# Patient Record
Sex: Female | Born: 1935 | Race: Black or African American | Hispanic: No | Marital: Single | Smoking: Never smoker
Health system: Southern US, Community
[De-identification: ages and names within clinical notes are randomized; demographics above are authoritative.]

## PROBLEM LIST (undated history)

## (undated) DIAGNOSIS — E119 Type 2 diabetes mellitus without complications: Secondary | ICD-10-CM

## (undated) DIAGNOSIS — I10 Essential (primary) hypertension: Secondary | ICD-10-CM

## (undated) DIAGNOSIS — J45909 Unspecified asthma, uncomplicated: Secondary | ICD-10-CM

## (undated) HISTORY — PX: ABDOMINAL HYSTERECTOMY: SHX81

---

## 2016-07-22 ENCOUNTER — Emergency Department (HOSPITAL_COMMUNITY): Payer: Medicare Other

## 2016-07-22 ENCOUNTER — Emergency Department (HOSPITAL_COMMUNITY)
Admission: EM | Admit: 2016-07-22 | Discharge: 2016-07-22 | Disposition: A | Discharge status: Home / Self Care | Payer: Medicare Other | Attending: Emergency Medicine | Admitting: Emergency Medicine

## 2016-07-22 ENCOUNTER — Encounter (HOSPITAL_COMMUNITY): Payer: Self-pay | Admitting: Emergency Medicine

## 2016-07-22 DIAGNOSIS — J45909 Unspecified asthma, uncomplicated: Secondary | ICD-10-CM | POA: Diagnosis not present

## 2016-07-22 DIAGNOSIS — I951 Orthostatic hypotension: Secondary | ICD-10-CM | POA: Diagnosis not present

## 2016-07-22 DIAGNOSIS — R4182 Altered mental status, unspecified: Secondary | ICD-10-CM | POA: Diagnosis present

## 2016-07-22 DIAGNOSIS — I10 Essential (primary) hypertension: Secondary | ICD-10-CM | POA: Diagnosis not present

## 2016-07-22 DIAGNOSIS — E119 Type 2 diabetes mellitus without complications: Secondary | ICD-10-CM | POA: Insufficient documentation

## 2016-07-22 DIAGNOSIS — R55 Syncope and collapse: Secondary | ICD-10-CM

## 2016-07-22 DIAGNOSIS — Z794 Long term (current) use of insulin: Secondary | ICD-10-CM | POA: Insufficient documentation

## 2016-07-22 HISTORY — DX: Type 2 diabetes mellitus without complications: E11.9

## 2016-07-22 HISTORY — DX: Unspecified asthma, uncomplicated: J45.909

## 2016-07-22 HISTORY — DX: Essential (primary) hypertension: I10

## 2016-07-22 LAB — URINALYSIS, ROUTINE W REFLEX MICROSCOPIC
BILIRUBIN URINE: NEGATIVE
Bacteria, UA: NONE SEEN
Hgb urine dipstick: NEGATIVE
Ketones, ur: 20 mg/dL — AB
LEUKOCYTES UA: NEGATIVE
NITRITE: NEGATIVE
PH: 5 (ref 5.0–8.0)
Protein, ur: NEGATIVE mg/dL
SPECIFIC GRAVITY, URINE: 1.012 (ref 1.005–1.030)
Squamous Epithelial / LPF: NONE SEEN

## 2016-07-22 LAB — COMPREHENSIVE METABOLIC PANEL
ALBUMIN: 3.7 g/dL (ref 3.5–5.0)
ALK PHOS: 69 U/L (ref 38–126)
ALT: 12 U/L — ABNORMAL LOW (ref 14–54)
ANION GAP: 12 (ref 5–15)
AST: 17 U/L (ref 15–41)
BILIRUBIN TOTAL: 0.9 mg/dL (ref 0.3–1.2)
BUN: 8 mg/dL (ref 6–20)
CALCIUM: 9.4 mg/dL (ref 8.9–10.3)
CO2: 25 mmol/L (ref 22–32)
Chloride: 101 mmol/L (ref 101–111)
Creatinine, Ser: 0.89 mg/dL (ref 0.44–1.00)
GFR calc non Af Amer: 60 mL/min — ABNORMAL LOW (ref >60)
GLUCOSE: 269 mg/dL — AB (ref 65–99)
POTASSIUM: 3.7 mmol/L (ref 3.5–5.1)
SODIUM: 138 mmol/L (ref 135–145)
TOTAL PROTEIN: 6.6 g/dL (ref 6.5–8.1)

## 2016-07-22 LAB — I-STAT TROPONIN, ED: Troponin i, poc: 0.01 ng/mL (ref 0.00–0.08)

## 2016-07-22 LAB — CBG MONITORING, ED: GLUCOSE-CAPILLARY: 269 mg/dL — AB (ref 65–99)

## 2016-07-22 LAB — CBC WITH DIFFERENTIAL/PLATELET
BASOS ABS: 0 10*3/uL (ref 0.0–0.1)
BASOS PCT: 1 %
Eosinophils Absolute: 0 10*3/uL (ref 0.0–0.7)
Eosinophils Relative: 1 %
HEMATOCRIT: 40.6 % (ref 36.0–46.0)
HEMOGLOBIN: 14 g/dL (ref 12.0–15.0)
LYMPHS PCT: 31 %
Lymphs Abs: 1.1 10*3/uL (ref 0.7–4.0)
MCH: 32 pg (ref 26.0–34.0)
MCHC: 34.5 g/dL (ref 30.0–36.0)
MCV: 92.9 fL (ref 78.0–100.0)
Monocytes Absolute: 0.2 10*3/uL (ref 0.1–1.0)
Monocytes Relative: 5 %
NEUTROS ABS: 2.3 10*3/uL (ref 1.7–7.7)
NEUTROS PCT: 62 %
Platelets: 174 10*3/uL (ref 150–400)
RBC: 4.37 MIL/uL (ref 3.87–5.11)
RDW: 12.9 % (ref 11.5–15.5)
WBC: 3.7 10*3/uL — ABNORMAL LOW (ref 4.0–10.5)

## 2016-07-22 LAB — LIPASE, BLOOD: Lipase: 19 U/L (ref 11–51)

## 2016-07-22 MED ORDER — SODIUM CHLORIDE 0.9 % IV BOLUS (SEPSIS)
500.0000 mL | Freq: Once | INTRAVENOUS | Status: AC
  Administered 2016-07-22: 500 mL via INTRAVENOUS

## 2016-07-22 NOTE — ED Notes (Signed)
EDP made aware of patient's altered mental status.

## 2016-07-22 NOTE — ED Notes (Signed)
Patient given sandwich and ginger ale 

## 2016-07-22 NOTE — ED Notes (Signed)
PT was ambulated to the restroom but was able to urinate into the hat.  A sample was not able to be obtained.  ED-P made aware.  Giving pt fluids and going to try and in and out cath.

## 2016-07-22 NOTE — ED Provider Notes (Signed)
[facility]PT Provider Note   CSN: [id number] Arrival date & time: 07/22/16  1636     History   Chief Complaint Chief Complaint  Patient presents with  . Altered Mental Status    HPI Alyssa Haney is a 83 y.o. female.  HPI Patient is cared for by her daughter in her home. The patient's daughter reports that she went out to the store for about 15 minutes. She was sure she left her mother seem normal at her baseline. When she returned her mother reported she didn't feel well and needed to go to the hospital. She cited some abdominal pain. The patient's daughter reports she was asking her more questions and then she slumped over to the side and became poorly responsive. She then called EMS. He did not note one focal area dysfunction such as one extremity or speech slurring. He reports the patient was at her baseline health for this episode. He does report at baseline however she has very poor recall and memory. Patient has had poorly controlled diabetes. Patient's daughter does report that the patient had been noncompliant with her blood pressure medications and her insulin prior to coming to stay with her, probably for about a month or more. Her daughter reports that she did have her take her blood pressure medications this morning and wonders if maybe that contributed to her episode. Past Medical History:  Diagnosis Date  . Asthma   . Diabetes mellitus without complication (HCC)   . Hypertension     There are no active problems to display for this patient.   Past Surgical History:  Procedure Laterality Date  . ABDOMINAL HYSTERECTOMY      OB History    No data available       Home Medications    Prior to Admission medications   Medication Sig Start Date End Date Taking? Authorizing Provider  acetaminophen (TYLENOL) 500 MG tablet Take 500 mg by mouth every 6 (six) hours as needed for mild pain.   Yes Historical Provider, MD  albuterol (PROVENTIL HFA;VENTOLIN HFA)  108 (90 Base) MCG/ACT inhaler Inhale 1 puff into the lungs every 6 (six) hours as needed for wheezing or shortness of breath.   Yes Historical Provider, MD  atorvastatin (LIPITOR) 10 MG tablet Take 10 mg by mouth every evening.   Yes Historical Provider, MD  gabapentin (NEURONTIN) 400 MG capsule Take 400 mg by mouth every 8 (eight) hours as needed. For pain   Yes Historical Provider, MD  hydrochlorothiazide (HYDRODIURIL) 25 MG tablet Take 25 mg by mouth daily.   Yes Historical Provider, MD  insulin aspart protamine- aspart (NOVOLOG MIX 70/30) (70-30) 100 UNIT/ML injection Inject into the skin.   Yes Historical Provider, MD  losartan (COZAAR) 25 MG tablet Take 25 mg by mouth daily.   Yes Historical Provider, MD    Family History History reviewed. No pertinent family history.  Social History Social History  Substance Use Topics  . Smoking status: Never Smoker  . Smokeless tobacco: Never Used  . Alcohol use No     Allergies   Penicillins; Potassium-containing compounds; and Shrimp [shellfish allergy]   Review of Systems Review of Systems 10 Systems reviewed and are negative for acute change except as noted in the HPI.   Physical Exam Updated Vital Signs BP 152/87   Pulse 76   Temp 97.8 F (36.6 C) (Oral)   Resp 17   Ht 5\' 3"  (1.6 m)   Wt 115 lb (52.2 kg)  SpO2 98%   BMI 20.37 kg/m   Physical Exam  Constitutional: She appears well-developed and well-nourished. No distress.  Consideration is alert and in no distress. No respiratory distress.  HENT:  Head: Normocephalic and atraumatic.  Nose: Nose normal.  Mouth/Throat: Oropharynx is clear and moist.  Eyes: EOM are normal. Pupils are equal, round, and reactive to light.  Neck: Neck supple.  Cardiovascular: Normal rate, regular rhythm, normal heart sounds and intact distal pulses.   Pulmonary/Chest: Effort normal and breath sounds normal.  Abdominal: Soft. She exhibits no distension. There is no tenderness.    Musculoskeletal: Normal range of motion. She exhibits no edema, tenderness or deformity.  Neurological:  Patient is alert and interactive. She however cannot tell me time and location. Patient's daughter reports this is not atypical for her. She has 5\5 upper extremity strength. No pronator drift. Patient can elevate each leg independently off the bed and hold 5 seconds. No cranial nerve deficit.  Skin: Skin is warm and dry.  Psychiatric: She has a normal mood and affect.     ED Treatments / Results  Labs (all labs ordered are listed, but only abnormal results are displayed) Labs Reviewed  COMPREHENSIVE METABOLIC PANEL - Abnormal; Notable for the following:       Result Value   Glucose, Bld 269 (*)    ALT 12 (*)    GFR calc non Af Amer 60 (*)    All other components within normal limits  CBC WITH DIFFERENTIAL/PLATELET - Abnormal; Notable for the following:    WBC 3.7 (*)    All other components within normal limits  URINALYSIS, ROUTINE W REFLEX MICROSCOPIC - Abnormal; Notable for the following:    Color, Urine STRAW (*)    Glucose, UA >=500 (*)    Ketones, ur 20 (*)    All other components within normal limits  CBG MONITORING, ED - Abnormal; Notable for the following:    Glucose-Capillary 269 (*)    All other components within normal limits  LIPASE, BLOOD  I-STAT TROPOININ, ED    EKG  EKG Interpretation  Date/Time:  Monday July 22 2016 16:46:45 EST Ventricular Rate:  77 PR Interval:    QRS Duration: 116 QT Interval:  387 QTC Calculation: 421 R Axis:   40 Text Interpretation:  Sinus rhythm Ventricular premature complex Sinus pause Nonspecific intraventricular conduction delay Low voltage, precordial leads Probable anteroseptal infarct, old Baseline wander in lead(s) I II aVR agree. nonspecific t wave changes Confirmed by Donnald GarrePfeiffer, MD, Lebron ConnersMarcy [phone]([id number]) on 07/22/2016 4:52:00 PM       Radiology Dg Chest 2 View  Result Date: 07/22/2016 CLINICAL DATA:  Unresponsive  patient. Altered mental status and confusion. EXAM: CHEST  2 VIEW COMPARISON:  None. FINDINGS: Atherosclerotic aortic arch. Heart size within normal limits. Thoracic spondylosis. Bony demineralization. The lungs appear clear.  No pleural effusion. IMPRESSION: 1.  No active cardiopulmonary disease is radiographically apparent. 2. Atherosclerotic aortic arch. 3. Thoracic spondylosis. Electronically Signed   By: Gaylyn RongWalter  Liebkemann M.D.   On: 07/22/2016 18:30   Ct Head Wo Contrast  Result Date: 07/22/2016 CLINICAL DATA:  Acute onset of altered mental status. Initial encounter. EXAM: CT HEAD WITHOUT CONTRAST TECHNIQUE: Contiguous axial images were obtained from the base of the skull through the vertex without intravenous contrast. COMPARISON:  None. FINDINGS: Brain: No evidence of acute infarction, hemorrhage, hydrocephalus, extra-axial collection or mass lesion/mass effect. Prominence of the ventricles and sulci reflects mild cortical volume loss. Scattered periventricular and subcortical white  matter change likely reflects small ischemic microangiopathy. The brainstem and fourth ventricle are within normal limits. The basal ganglia are unremarkable in appearance. The cerebral hemispheres demonstrate grossly normal gray-white differentiation. No mass effect or midline shift is seen. Vascular: No hyperdense vessel or unexpected calcification. Skull: There is no evidence of fracture; visualized osseous structures are unremarkable in appearance. Sinuses/Orbits: The visualized portions of the orbits are within normal limits. The paranasal sinuses and mastoid air cells are well-aerated. Other: No significant soft tissue abnormalities are seen. IMPRESSION: 1. No acute intracranial pathology seen on CT. 2. Mild cortical volume loss and scattered small vessel ischemic microangiopathy. Electronically Signed   By: Roanna Raider M.D.   On: 07/22/2016 18:56    Procedures Procedures (including critical care  time)  Medications Ordered in ED Medications  sodium chloride 0.9 % bolus 500 mL (0 mLs Intravenous Stopped 07/22/16 2107)     Initial Impression / Assessment and Plan / ED Course  I have reviewed the triage vital signs and the nursing notes.  Pertinent labs & imaging results that were available during my care of the patient were reviewed by me and considered in my medical decision making (see chart for details).  Clinical Course     Final Clinical Impressions(s) / ED Diagnoses   Final diagnoses:  Near syncope  Orthostatic hypotension   Patient had a fairly acute onset of feeling weak and slumping over. He denied associated symptoms. Diagnostic workup does not show acute anomaly. The patient did have positive orthostatic testing. Patient had just resumed blood pressure medications today after being noncompliant for approximately a month. The patient is well in appearance, I suspect likely near syncope due to orthostatic hypotension. Patient  has had some rehydration in the emergency department and now has been up and ambulatory and remained in stable condition. I have counseled the patient's daughter to withhold the hydrochlorothiazide for the next several days and monitor her home blood pressures. They will continue her losartan and diabetic medications. Patient's daughters counseled on signs and symptoms for which return. New Prescriptions New Prescriptions   No medications on file     Arby Barrette, MD 07/22/16 2328

## 2016-07-22 NOTE — ED Triage Notes (Signed)
Per EMS, patient's family called EMS because of unresponsiveness.  When EMS arrived patient was slumped in chair but did respond when called by name or asked questions.  CBG 299, 143/80, 72 HR, 100% RA, RR 18.  LSN at 1515.  Sinus on monitor.  22G left wrist.  Patient complaining of abdominal pain on right side.  Hx of constipation.  NIH 0.  Electing to be non-verbal per EMS.  VS stable.  No meds given en route.   Denies N/V/D/fever.  Daughter bringing med list.

## 2016-07-22 NOTE — ED Notes (Signed)
Pt complaining of pain on lower right abdominal region.

## 2016-07-22 NOTE — Discharge Instructions (Signed)
Do not take your hydrochlorothiazide for the next 2-3 days. Monitor your blood pressure. If your blood pressure remains greater than 140 (top number), you may resume taking your hydrochlorothiazide.

## 2016-08-09 ENCOUNTER — Encounter (HOSPITAL_COMMUNITY): Payer: Self-pay | Admitting: Emergency Medicine

## 2016-08-09 ENCOUNTER — Emergency Department (HOSPITAL_COMMUNITY)
Admission: EM | Admit: 2016-08-09 | Discharge: 2016-08-09 | Disposition: A | Discharge status: Home / Self Care | Payer: Medicare Other | Attending: Emergency Medicine | Admitting: Emergency Medicine

## 2016-08-09 DIAGNOSIS — E119 Type 2 diabetes mellitus without complications: Secondary | ICD-10-CM | POA: Insufficient documentation

## 2016-08-09 DIAGNOSIS — J45909 Unspecified asthma, uncomplicated: Secondary | ICD-10-CM | POA: Diagnosis not present

## 2016-08-09 DIAGNOSIS — Y939 Activity, unspecified: Secondary | ICD-10-CM | POA: Diagnosis not present

## 2016-08-09 DIAGNOSIS — W01198A Fall on same level from slipping, tripping and stumbling with subsequent striking against other object, initial encounter: Secondary | ICD-10-CM | POA: Insufficient documentation

## 2016-08-09 DIAGNOSIS — Z794 Long term (current) use of insulin: Secondary | ICD-10-CM | POA: Diagnosis not present

## 2016-08-09 DIAGNOSIS — I10 Essential (primary) hypertension: Secondary | ICD-10-CM | POA: Diagnosis not present

## 2016-08-09 DIAGNOSIS — Y999 Unspecified external cause status: Secondary | ICD-10-CM | POA: Insufficient documentation

## 2016-08-09 DIAGNOSIS — W19XXXA Unspecified fall, initial encounter: Secondary | ICD-10-CM

## 2016-08-09 DIAGNOSIS — S0993XA Unspecified injury of face, initial encounter: Secondary | ICD-10-CM | POA: Diagnosis present

## 2016-08-09 DIAGNOSIS — Z79899 Other long term (current) drug therapy: Secondary | ICD-10-CM | POA: Diagnosis not present

## 2016-08-09 DIAGNOSIS — Y92019 Unspecified place in single-family (private) house as the place of occurrence of the external cause: Secondary | ICD-10-CM | POA: Diagnosis not present

## 2016-08-09 DIAGNOSIS — S0083XA Contusion of other part of head, initial encounter: Secondary | ICD-10-CM | POA: Diagnosis not present

## 2016-08-09 MED ORDER — HALOPERIDOL LACTATE 5 MG/ML IJ SOLN
3.0000 mg | Freq: Once | INTRAMUSCULAR | Status: AC
  Administered 2016-08-09: 3 mg via INTRAMUSCULAR
  Filled 2016-08-09: qty 1

## 2016-08-09 MED ORDER — LORAZEPAM 1 MG PO TABS
0.5000 mg | ORAL_TABLET | Freq: Once | ORAL | Status: AC
  Administered 2016-08-09: 0.5 mg via ORAL
  Filled 2016-08-09: qty 1

## 2016-08-09 NOTE — ED Triage Notes (Signed)
Pt tripped on steps walking out of house and hit face on concrete. Pt has abrasions to right side of face around eye and forehead. Bleeding controlled. Pt did not lose consciousness. Pt alert and oriented to baseline. Denies neck pain. CBG 415. Pt is ambulatory. BP 150/90, HR 96

## 2016-08-09 NOTE — ED Notes (Signed)
Abrasions to R side of face cleaned with saline, bacitracin applied; pt tolerated well.

## 2016-08-09 NOTE — ED Provider Notes (Signed)
[facility] Provider Note   CSN: [id number] Arrival date & time: 08/09/16  1004     History   Chief Complaint Chief Complaint  Patient presents with  . Fall    HPI Maia PettiesMary Brue is a 83 y.o. female.   HPI   83 year old female presenting after fall. She is coming down a few stabs out of the house when she tripped and fell forward. She struck her face on concrete surface. No loss of consciousness. She denies any acute pain. She is generally very agitated and pacing around her room. She has had to be returned to her room multiple times by various staff members. Her daughter reports that she recently [place] from [place] to live with her. Her behavior has been very difficult to deal with. Her daughter feels very overwhelmed with her behavior. She reports that her behavior exhibited the emergency room today is consistent with the way she has been behaving at home for the past several weeks. She often refusing to take her medications. With me she is very uncooperative. She known she is in the hospital but insists she doesn't need to be here. She denies having any pain anywhere. No blood thinners per med list. No vomiting.   Past Medical History:  Diagnosis Date  . Asthma   . Diabetes mellitus without complication (HCC)   . Hypertension     There are no active problems to display for this patient.   Past Surgical History:  Procedure Laterality Date  . ABDOMINAL HYSTERECTOMY      OB History    No data available       Home Medications    Prior to Admission medications   Medication Sig Start Date End Date Taking? Authorizing Provider  acetaminophen (TYLENOL) 500 MG tablet Take 500 mg by mouth every 6 (six) hours as needed for mild pain.    Historical Provider, MD  albuterol (PROVENTIL HFA;VENTOLIN HFA) 108 (90 Base) MCG/ACT inhaler Inhale 1 puff into the lungs every 6 (six) hours as needed for wheezing or shortness of breath.    Historical Provider, MD    atorvastatin (LIPITOR) 10 MG tablet Take 10 mg by mouth every evening.    Historical Provider, MD  gabapentin (NEURONTIN) 400 MG capsule Take 400 mg by mouth every 8 (eight) hours as needed. For pain    Historical Provider, MD  hydrochlorothiazide (HYDRODIURIL) 25 MG tablet Take 25 mg by mouth daily.    Historical Provider, MD  insulin aspart protamine- aspart (NOVOLOG MIX 70/30) (70-30) 100 UNIT/ML injection Inject into the skin.    Historical Provider, MD  losartan (COZAAR) 25 MG tablet Take 25 mg by mouth daily.    Historical Provider, MD    Family History History reviewed. No pertinent family history.  Social History Social History  Substance Use Topics  . Smoking status: Never Smoker  . Smokeless tobacco: Never Used  . Alcohol use No     Allergies   Penicillins; Potassium-containing compounds; and Shrimp [shellfish allergy]   Review of Systems Review of Systems  All systems reviewed and negative, other than as noted in HPI.   Physical Exam Updated Vital Signs BP 148/76   Pulse 79   Temp 98.5 F (36.9 C) (Oral)   Resp 20   SpO2 100%   Physical Exam  Constitutional: She appears well-developed and well-nourished. No distress.  Sitting in bed. She is agitated. She is only briefly redirectable.  HENT:  Head: Normocephalic.  Several abrasions around the right  eye and right cheek. No active bleeding. Significant facial tenderness. No midline spinal tenderness.  Eyes: Conjunctivae are normal. Right eye exhibits no discharge. Left eye exhibits no discharge.  Neck: Neck supple.  Cardiovascular: Normal rate, regular rhythm and normal heart sounds.  Exam reveals no gallop and no friction rub.   No murmur heard. Pulmonary/Chest: Effort normal and breath sounds normal. No respiratory distress.  Abdominal: Soft. She exhibits no distension. There is no tenderness.  Musculoskeletal: She exhibits no edema or tenderness.  Range of motion the large joints without any apparent  discomfort.  Neurological: She is alert.  Cranial nerves II through XII are intact bilaterally. Very good strength bilateral upper and lower extremities.  Skin: Skin is warm and dry.  Psychiatric:  Agitated. She is upset about having to be in the ED. She needs constant redirection.   Nursing note and vitals reviewed.    ED Treatments / Results  Labs (all labs ordered are listed, but only abnormal results are displayed) Labs Reviewed - No data to display  EKG  EKG Interpretation None       Radiology No results found.  Procedures Procedures (including critical care time)  Medications Ordered in ED Medications  haloperidol lactate (HALDOL) injection 3 mg (3 mg Intramuscular Given 08/09/16 1448)  LORazepam (ATIVAN) tablet 0.5 mg (0.5 mg Oral Given 08/09/16 1447)     Initial Impression / Assessment and Plan / ED Course  I have reviewed the triage vital signs and the nursing notes.  Pertinent labs & imaging results that were available during my care of the patient were reviewed by me and considered in my medical decision making (see chart for details).     83 year old female presenting after a fall. She does have some abrasions to her face. Overall, I have a very low suspicion for significant facial fracture or intracranial bleeding. Her daughter is very concerned about her ability to safely take care for the patient at home though. Her behavior is very erratic and she is often combative. This is not new behavior since his fall today. Her daughter is requesting to speak with social work she is not sure where to turn to next. I think this is very reasonable. We'll have to give her some sedating medications just keep her from walking health. Realistically though, we probably won't be able to place tonight. I doubt pt would be agreeable to this anyways. Daughter may have to take her home with her but will at least try to see what options she may have.  5:15 PM Pt now calmer but  works herself up when you talk to her more than briefly. She still is up and ambulatory. Has been in the ED over 7 hours now. I still do not feel strongly she needs neuro imaging.   Final Clinical Impressions(s) / ED Diagnoses   Final diagnoses:  Fall, initial encounter  Contusion of face, initial encounter    New Prescriptions New Prescriptions   No medications on file     Raeford Razor, MD 08/21/16 1312

## 2016-08-09 NOTE — ED Triage Notes (Signed)
Gave pt ice pack for swelling to face and placed gauze over forehead

## 2016-08-09 NOTE — Progress Notes (Signed)
CSW spoke with pt re: ALF placement for pt. At this time, pt's has [place] Medicaid and this would need to be transitioned to [place] in order for pt to (potentially) receive help to pay for ALF care.  Daughter encouraged to [place] to [place].  Daughter also provided contact information for Adult Day Care.  Private pay services may be an option for pt and daughter has a few contacts who may be available for hire.  Emotional support provided.

## 2018-04-25 IMAGING — CT CT HEAD W/O CM
4 series · 16 of 47 positions shown, 18 images · non-contrast
Comparison: None.

CLINICAL DATA: Acute onset of altered mental status. Initial
encounter.

EXAM:
CT HEAD WITHOUT CONTRAST
TECHNIQUE: Contiguous axial images were obtained from the base of the skull
through the vertex without intravenous contrast.

[Series 2: head without · axial · non-contrast · 0.43mm/px · z∈[+1120,+1230]mm · 7 of 30 slices shown, 9 images]
[im 4/30  brain]
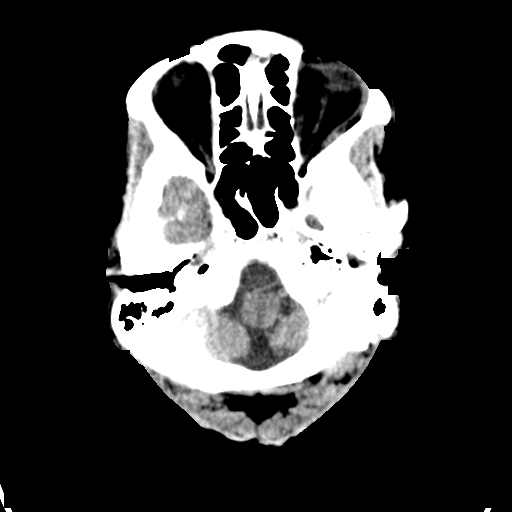
[im 4/30  bone]
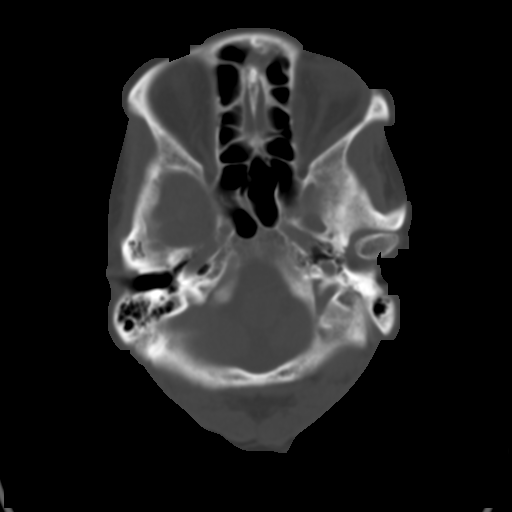
[im 8/30  brain]
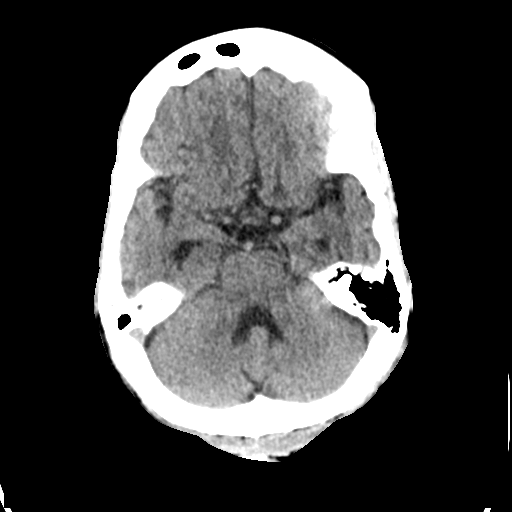
[im 11/30  brain]
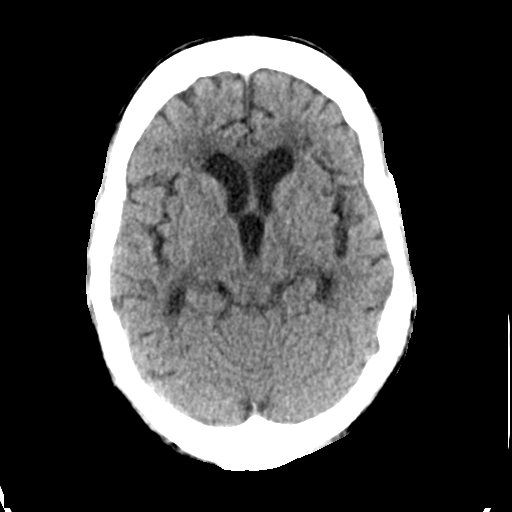
[im 15/30  brain]
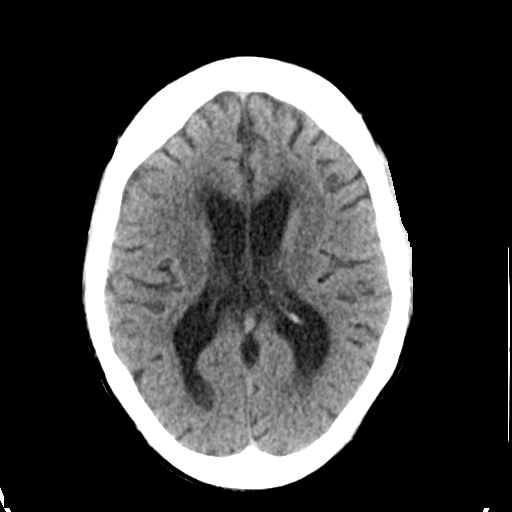
[im 19/30  brain]
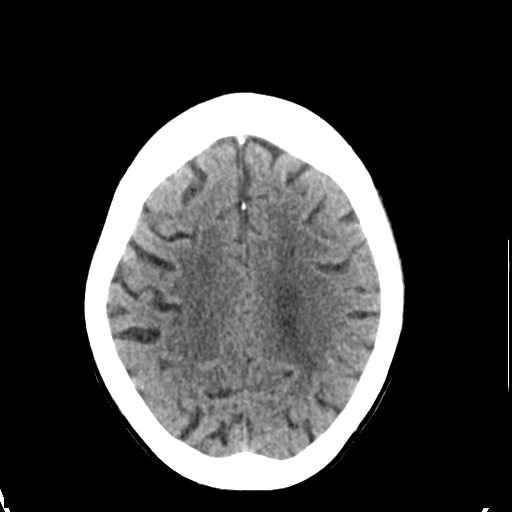
[im 19/30  bone]
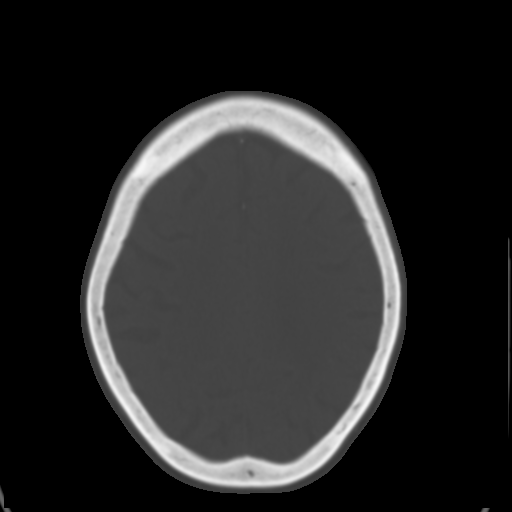
[im 22/30  brain]
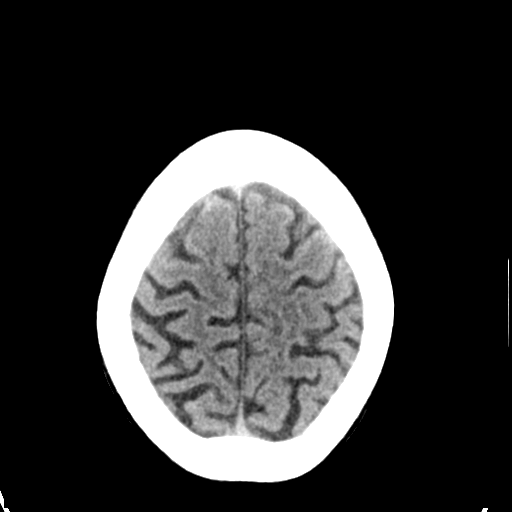
[im 26/30  brain]
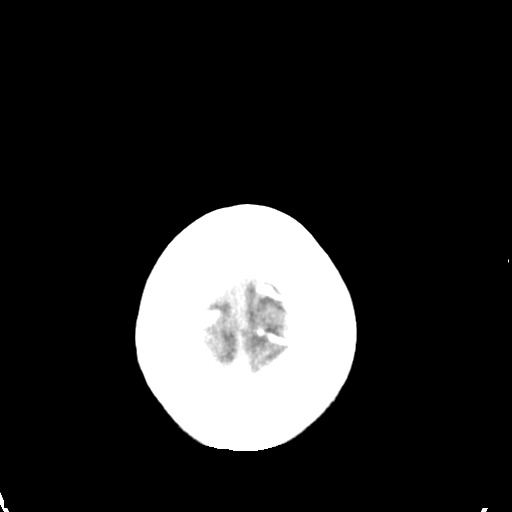

[Series 3: head bone · axial · 0.43mm/px · z∈[+1119,+1147]mm · 3 of 74 slices shown]
[im 8/74  bone]
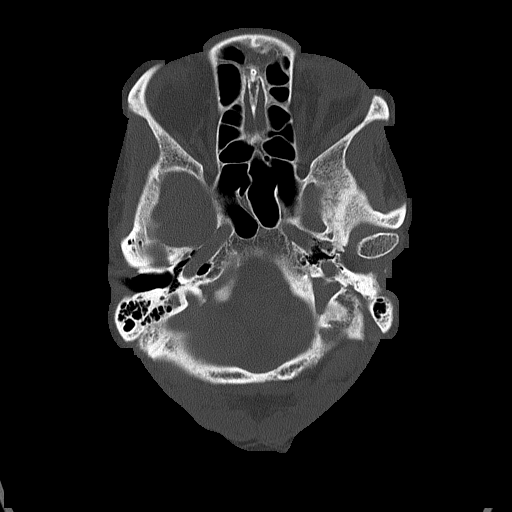
[im 15/74  bone]
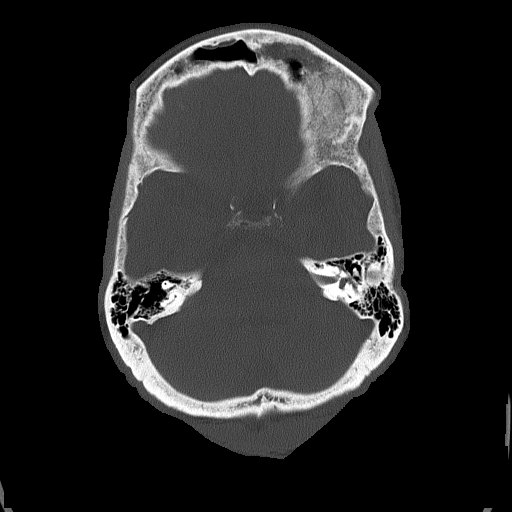
[im 22/74  bone]
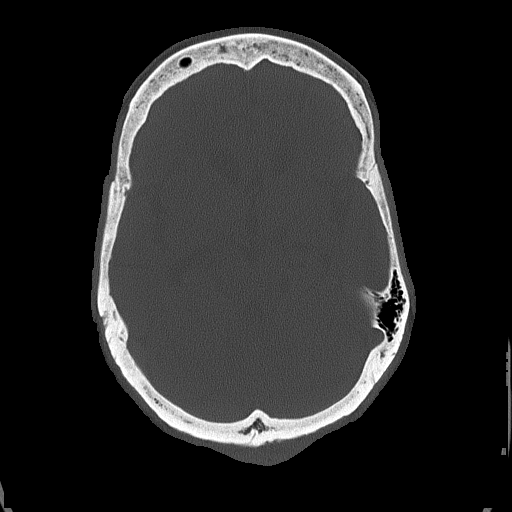

[Series 4: head without cor · coronal · non-contrast · 0.29mm/px · 3 of 67 slices shown]
[im 23/67  brain]
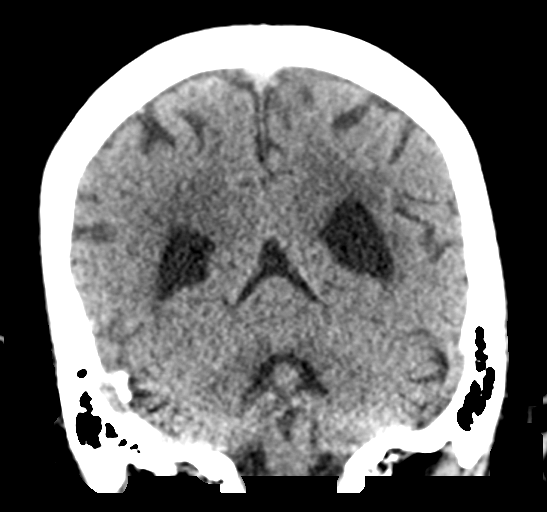
[im 30/67  brain]
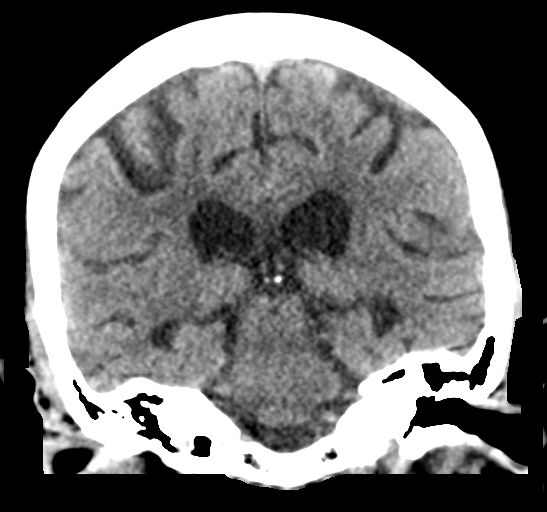
[im 37/67  brain]
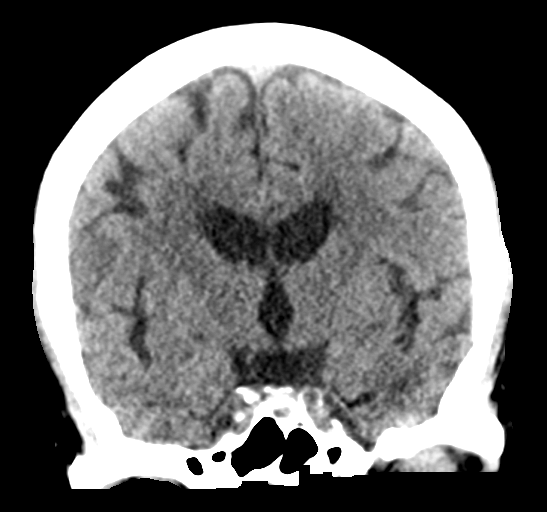

[Series 5: head without sag · sagittal · non-contrast · 0.29mm/px · 3 of 66 slices shown]
[im 22/66  brain]
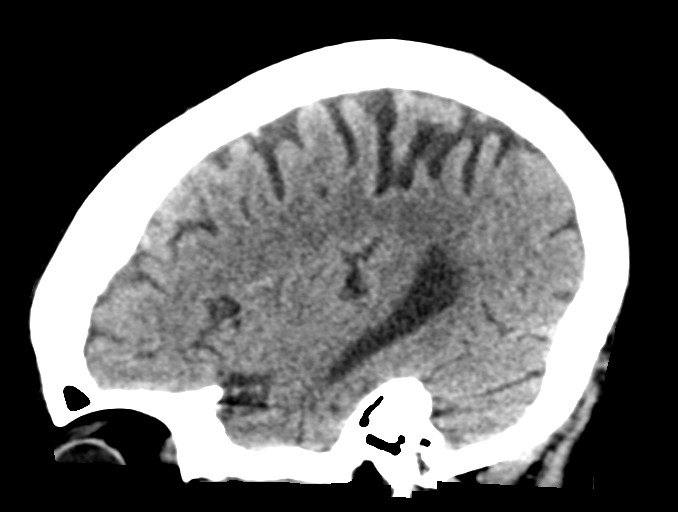
[im 33/66  brain]
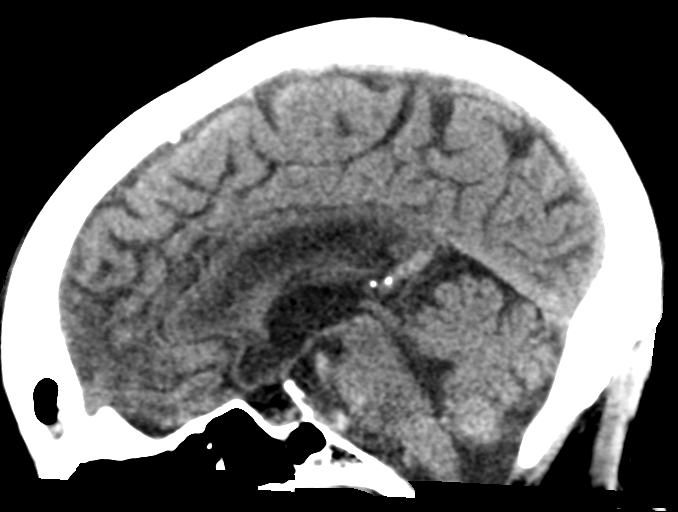
[im 44/66  brain]
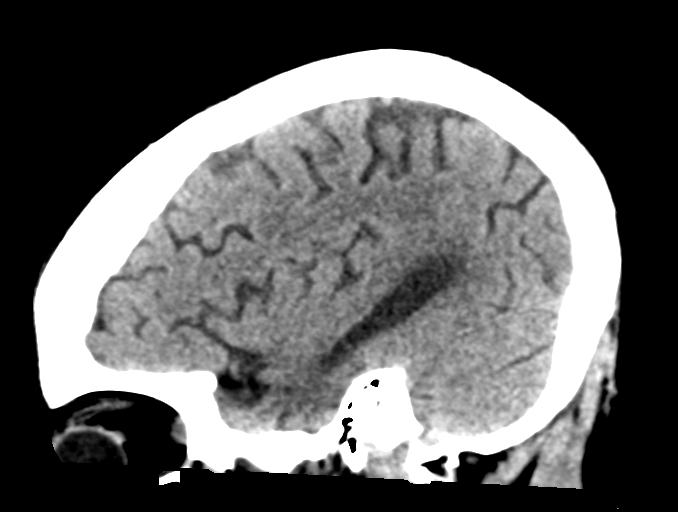

[16 of 47 positions shown; findings below may reference images not displayed]

FINDINGS: Brain: No evidence of acute infarction, hemorrhage, hydrocephalus,
extra-axial collection or mass lesion/mass effect.

Prominence of the ventricles and sulci reflects mild cortical volume
loss. Scattered periventricular and subcortical white matter change
likely reflects small ischemic microangiopathy.

The brainstem and fourth ventricle are within normal limits. The
basal ganglia are unremarkable in appearance. The cerebral
hemispheres demonstrate grossly normal gray-white differentiation.
No mass effect or midline shift is seen.

Vascular: No hyperdense vessel or unexpected calcification.

Skull: There is no evidence of fracture; visualized osseous
structures are unremarkable in appearance.

Sinuses/Orbits: The visualized portions of the orbits are within
normal limits. The paranasal sinuses and mastoid air cells are
well-aerated.

Other: No significant soft tissue abnormalities are seen.
IMPRESSION: 1. No acute intracranial pathology seen on CT.
2. Mild cortical volume loss and scattered small vessel ischemic
microangiopathy.
